# Patient Record
Sex: Female | Born: 1960
Health system: Southern US, Community
[De-identification: ages and names within clinical notes are randomized; demographics above are authoritative.]

## PROBLEM LIST (undated history)

## (undated) DIAGNOSIS — I10 Essential (primary) hypertension: Secondary | ICD-10-CM

## (undated) DIAGNOSIS — I639 Cerebral infarction, unspecified: Secondary | ICD-10-CM

## (undated) DIAGNOSIS — E119 Type 2 diabetes mellitus without complications: Secondary | ICD-10-CM

---

## 2016-02-04 MED FILL — LISINOPRIL 20 MG TABLET: 20 | 30 days supply | Qty: 30 | Fill #0

## 2016-03-10 MED FILL — LISINOPRIL 20 MG TABLET: 20 | 30 days supply | Qty: 30 | Fill #0

## 2016-03-29 MED FILL — METOPROLOL SUCC ER 25 MG TA: 25 | 30 days supply | Qty: 30 | Fill #0

## 2016-03-29 MED FILL — traMADol HCL 50 MG TABS: 50 | 10 days supply | Qty: 30 | Fill #0

## 2016-04-07 MED FILL — CLOPIDOGREL 75 MG TABLET: 75 | 30 days supply | Qty: 30 | Fill #0

## 2016-04-07 MED FILL — ISOSORBIDE MN ER 30 MG TAB: 30 | 30 days supply | Qty: 15 | Fill #0

## 2016-04-07 MED FILL — FAMOTIDINE 20 MG TABLET: 20 | 30 days supply | Qty: 60 | Fill #0

## 2016-04-07 MED FILL — ?ATORVASTATIN 40MG TABLET: 40 | 30 days supply | Qty: 30 | Fill #0

## 2016-04-12 MED FILL — GABAPENTIN 300 MG CAPSULE: 300 | 30 days supply | Qty: 90 | Fill #0

## 2016-04-15 MED FILL — ?LISINOPRIL 20 MG TABLET: 20 | 30 days supply | Qty: 60 | Fill #0

## 2016-05-04 MED FILL — FAMOTIDINE 20 MG TABLET: 20 | 30 days supply | Qty: 60 | Fill #0

## 2016-05-05 MED FILL — ?ATORVASTATIN 40MG TABLET: 40 | 30 days supply | Qty: 30 | Fill #0

## 2016-05-05 MED FILL — ISOSORBIDE MN ER 30 MG TAB: 30 | 30 days supply | Qty: 15 | Fill #1

## 2016-05-07 MED FILL — traMADol HCL 50 MG TABS: 50 | 10 days supply | Qty: 30 | Fill #0

## 2016-05-07 MED FILL — CLOPIDOGREL 75 MG TABLET: 75 | 30 days supply | Qty: 30 | Fill #0

## 2016-05-18 MED FILL — METOPROLOL SUCC ER 25 MG TA: 25 | 30 days supply | Qty: 30 | Fill #0

## 2016-05-18 MED FILL — ?LISINOPRIL 20 MG TABLET: 20 | 30 days supply | Qty: 60 | Fill #0

## 2016-05-20 MED FILL — ISOSORBIDE MN ER 30 MG TAB: 30 | 30 days supply | Qty: 30 | Fill #0

## 2016-05-25 MED FILL — DICLOFENAC SODIUM 1% GEL: 1 | 17 days supply | Qty: 100 | Fill #0

## 2016-05-28 MED FILL — CLOPIDOGREL 75 MG TABLET: 75 | 30 days supply | Qty: 30 | Fill #1

## 2016-05-28 MED FILL — GABAPENTIN 300 MG CAPSULE: 300 | 30 days supply | Qty: 90 | Fill #1

## 2016-05-28 MED FILL — CEPHALEXIN 500 MG CAPSULE: 500 | 7 days supply | Qty: 14 | Fill #0

## 2016-05-28 MED FILL — ATORVASTATIN 40 MG TABLET: 40 | 30 days supply | Qty: 30 | Fill #1

## 2016-05-31 MED FILL — ISOSORBIDE MN ER 60 MG TAB: 60 | 30 days supply | Qty: 30 | Fill #0

## 2016-06-11 MED FILL — LISINOPRIL 20 MG TABLET: 20 | 30 days supply | Qty: 60 | Fill #0

## 2016-07-05 MED FILL — ?FAMOTIDINE 20 MG TABLET: 20 | 30 days supply | Qty: 60 | Fill #0

## 2016-07-05 MED FILL — GABAPENTIN 300 MG CAPSULE: 300 | 30 days supply | Qty: 90 | Fill #0

## 2016-07-05 MED FILL — METOPROLOL SUCC ER 50 MG TA: 50 | 30 days supply | Qty: 30 | Fill #0

## 2016-07-05 MED FILL — ISOSORBIDE MN ER 60 MG TAB: 60 | 30 days supply | Qty: 30 | Fill #0

## 2016-07-05 MED FILL — CLOPIDOGREL 75 MG TABLET: 75 | 30 days supply | Qty: 30 | Fill #0

## 2016-07-05 MED FILL — traMADol HCL 50 MG TABS: 50 | 15 days supply | Qty: 45 | Fill #0

## 2016-07-05 MED FILL — ?ATORVASTATIN 40MG TABLET: 40 | 30 days supply | Qty: 30 | Fill #0

## 2016-07-05 MED FILL — LISINOPRIL 20 MG TABLET: 20 | 30 days supply | Qty: 60 | Fill #0

## 2016-07-16 MED FILL — VIT D2 1.25 MG (50,000 UNIT: 1.25 MG | 84 days supply | Qty: 12 | Fill #0

## 2016-07-27 MED FILL — METOPROLOL SUCC ER 50 MG TA: 50 | 30 days supply | Qty: 30 | Fill #1

## 2016-07-27 MED FILL — ISOSORBIDE MN ER 60 MG TAB: 60 | 30 days supply | Qty: 30 | Fill #1

## 2016-07-28 MED FILL — CLOPIDOGREL 75 MG TABLET: 75 | 30 days supply | Qty: 30 | Fill #1

## 2016-08-05 MED FILL — ?ATORVASTATIN 40MG TABLET: 40 | 30 days supply | Qty: 30 | Fill #1

## 2016-08-16 MED FILL — LISINOPRIL 20 MG TABLET: 20 | 30 days supply | Qty: 60 | Fill #1 | Status: TO

## 2016-08-16 MED FILL — traMADol HCL 50 MG TABS: 50 | 15 days supply | Qty: 45 | Fill #0

## 2016-08-16 MED FILL — ?FAMOTIDINE 20 MG TABLET: 20 | 30 days supply | Qty: 60 | Fill #1

## 2016-10-07 MED FILL — traMADol HCL 50 MG TABS: 50 | 10 days supply | Qty: 30 | Fill #0

## 2016-10-07 MED FILL — ?FAMOTIDINE 20 MG TABLET: 20 | 30 days supply | Qty: 60 | Fill #2

## 2016-10-11 MED FILL — CEPHALEXIN 500 MG CAPSULE: 500 | 7 days supply | Qty: 28 | Fill #0

## 2016-10-13 MED FILL — NAPROXEN 500 MG TABLET: 500 | 30 days supply | Qty: 60 | Fill #0

## 2016-10-15 MED FILL — traMADol HCL 50 MG TABS: 50 | 10 days supply | Qty: 30 | Fill #0

## 2016-11-03 MED FILL — traMADol HCL 50 MG TABS: 50 | 10 days supply | Qty: 30 | Fill #0

## 2017-01-19 MED FILL — NovoLOG 100 UNIT/ML SOLN: 100 | 28 days supply | Qty: 10 | Fill #0

## 2017-01-19 MED FILL — LANTUS 100 UNITS/ML VIAL: 100 | 28 days supply | Qty: 10 | Fill #0

## 2017-01-19 MED FILL — ULTICARE SYR 0.5 ML 30GX5/1: 30G X 5/16" | 30 days supply | Qty: 30 | Fill #0

## 2017-01-19 MED FILL — CIPROFLOXACIN HCL 500 MG TA: 500 | 8 days supply | Qty: 16 | Fill #0

## 2017-01-19 MED FILL — PANTOPRAZOLE SOD DR 40 MG T: 40 | 30 days supply | Qty: 30 | Fill #0

## 2017-02-23 MED FILL — hydrALAZINE HCL 50 MG TABS: 50 | 30 days supply | Qty: 90 | Fill #0

## 2017-02-23 MED FILL — ACETAMINOPHEN/COD #3 TABLET: 300-30 | 30 days supply | Qty: 60 | Fill #0

## 2017-02-23 MED FILL — METOPROLOL SUCC ER 50 MG TA: 50 | 30 days supply | Qty: 30 | Fill #0

## 2017-03-23 MED FILL — !NOVOLOG 100UNITS/ML VIAL: 100/ML | 27 days supply | Qty: 10 | Fill #0

## 2017-03-23 MED FILL — NAPROXEN 500 MG TABLET: 500 | 30 days supply | Qty: 60 | Fill #0

## 2017-03-23 MED FILL — ?ATORVASTATIN 40MG TABLET: 40 | 30 days supply | Qty: 30 | Fill #0

## 2017-03-23 MED FILL — GABAPENTIN 300 MG CAPSULE: 300 | 30 days supply | Qty: 180 | Fill #0

## 2017-03-23 MED FILL — hydrALAZINE HCL 50 MG TABS: 50 | 30 days supply | Qty: 90 | Fill #0

## 2017-03-23 MED FILL — METOPROLOL SUCC ER 50 MG TA: 50 | 30 days supply | Qty: 30 | Fill #0

## 2017-03-23 MED FILL — LISINOPRIL 20 MG TAB: 20 | 30 days supply | Qty: 60 | Fill #0

## 2017-03-23 MED FILL — !LANTUS 100 UNITS/ML VIAL: 100 | 28 days supply | Qty: 10 | Fill #0

## 2017-03-23 MED FILL — METHOCARBAMOL 500 MG TABLET: 500 | 30 days supply | Qty: 90 | Fill #0

## 2017-03-23 MED FILL — ?AMITRIPTYLINE HCL 25 MG TA: 25 | 30 days supply | Qty: 30 | Fill #0

## 2017-03-23 MED FILL — ISOSORBIDE MN ER 60 MG TAB: 60 | 30 days supply | Qty: 30 | Fill #0

## 2017-03-23 MED FILL — CLOPIDOGREL 75 MG TABLET: 75 | 30 days supply | Qty: 30 | Fill #0

## 2017-03-23 MED FILL — ACETAMINOPHEN/COD #3 TABLET: 300-30 | 30 days supply | Qty: 60 | Fill #1

## 2017-06-06 MED FILL — !LANTUS 100 UNITS/ML VIAL: 100 | 30 days supply | Qty: 10 | Fill #0

## 2017-06-06 MED FILL — risperiDONE 0.5 MG TABS: 0.5 | 30 days supply | Qty: 90 | Fill #0

## 2017-06-06 MED FILL — ?ATORVASTATIN 40MG TABLET: 40 | 30 days supply | Qty: 30 | Fill #0

## 2017-06-06 MED FILL — ?FLUOXETINE HCL 20 MG CAP: 20 | 30 days supply | Qty: 90 | Fill #0

## 2017-06-06 MED FILL — GABAPENTIN 300 MG CAPSULE: 300 | 15 days supply | Qty: 90 | Fill #0

## 2017-06-06 MED FILL — BENZTROPINE MES 0.5 MG TAB: 0.5 | 30 days supply | Qty: 60 | Fill #0

## 2017-06-23 MED FILL — hydrALAZINE HCL 50 MG TABS: 50 | 30 days supply | Qty: 90 | Fill #1

## 2017-06-23 MED FILL — ?AMITRIPTYLINE HCL 25 MG TA: 25 | 30 days supply | Qty: 30 | Fill #1

## 2017-06-23 MED FILL — METHOCARBAMOL 500 MG TABS: 500 | 30 days supply | Qty: 90 | Fill #1

## 2017-06-23 MED FILL — ?ATORVASTATIN 40MG TABLET: 40 | 30 days supply | Qty: 30 | Fill #1

## 2017-06-23 MED FILL — NAPROXEN 500 MG TABLET: 500 | 30 days supply | Qty: 60 | Fill #1

## 2017-06-23 MED FILL — ?CLOPIDOGREL 75MG TAB: 75 | 30 days supply | Qty: 30 | Fill #1

## 2017-06-23 MED FILL — ACETAMINOPHEN/COD #3 TABLET: 300-30 | 30 days supply | Qty: 60 | Fill #0

## 2017-06-23 MED FILL — ISOSORBIDE MN ER 60 MG TAB: 60 | 30 days supply | Qty: 30 | Fill #1

## 2017-06-23 MED FILL — LISINOPRIL 20 MG TAB: 20 | 30 days supply | Qty: 60 | Fill #1

## 2017-08-19 MED FILL — ISOSORBIDE MN ER 60 MG TAB: 60 | 30 days supply | Qty: 30 | Fill #2

## 2017-08-19 MED FILL — ?AMITRIPTYLINE HCL 25 MG TA: 25 | 30 days supply | Qty: 30 | Fill #2

## 2017-08-19 MED FILL — ?ATORVASTATIN 40MG TABLET: 40 | 30 days supply | Qty: 30 | Fill #2

## 2017-08-19 MED FILL — LISINOPRIL 20 MG TAB: 20 | 30 days supply | Qty: 60 | Fill #2

## 2017-08-19 MED FILL — ACETAMINOPHEN/COD #3 TABLET: 300-30 | 30 days supply | Qty: 60 | Fill #1

## 2017-08-19 MED FILL — hydrALAZINE HCL 50 MG TABS: 50 | 30 days supply | Qty: 90 | Fill #2

## 2017-09-21 MED FILL — GABAPENTIN 300 MG CAPSULE: 300 | 30 days supply | Qty: 180 | Fill #1

## 2017-09-21 MED FILL — ISOSORBIDE MN ER 60 MG TAB: 60 | 30 days supply | Qty: 30 | Fill #3

## 2017-09-21 MED FILL — hydrALAZINE HCL 50 MG TABS: 50 | 30 days supply | Qty: 90 | Fill #3

## 2017-09-21 MED FILL — ?AMITRIPTYLINE HCL 25 MG TA: 25 | 30 days supply | Qty: 30 | Fill #3

## 2017-09-21 MED FILL — LISINOPRIL 20 MG TAB: 20 | 30 days supply | Qty: 60 | Fill #3

## 2017-09-21 MED FILL — ?ATORVASTATIN 40MG TABLET: 40 | 30 days supply | Qty: 30 | Fill #3

## 2017-09-26 MED FILL — METHOCARBAMOL 500 MG TABLET: 500 | 30 days supply | Qty: 90 | Fill #0

## 2017-09-26 MED FILL — ?HUMALOG 100 UNITS/ML VIAL: 100 | 28 days supply | Qty: 10 | Fill #0

## 2017-09-26 MED FILL — ?DULOXETINE HCL 60 MG CPEP: 60 | 30 days supply | Qty: 30 | Fill #0

## 2017-09-26 MED FILL — !LANTUS 100 UNITS/ML VIAL: 100 | 28 days supply | Qty: 10 | Fill #0

## 2017-10-24 MED FILL — ?ATORVASTATIN 40MG TABLET: 40 | 30 days supply | Qty: 30 | Fill #4

## 2017-10-24 MED FILL — ?HUMALOG 100 UNITS/ML VIAL: 100 | 28 days supply | Qty: 10 | Fill #1

## 2017-10-24 MED FILL — ISOSORBIDE MN ER 60 MG TAB: 60 | 30 days supply | Qty: 30 | Fill #4

## 2017-10-24 MED FILL — $LANTUS 100 UNITS/ML VIAL: 100 | 28 days supply | Qty: 10 | Fill #1

## 2017-10-24 MED FILL — ?DULoxetine HCL 60MG CPEP: 60 | 30 days supply | Qty: 30 | Fill #1

## 2017-10-24 MED FILL — LISINOPRIL 20 MG TAB: 20 | 30 days supply | Qty: 60 | Fill #4

## 2017-10-24 MED FILL — ?AMITRIPTYLINE HCL 25 MG TA: 25 | 30 days supply | Qty: 30 | Fill #4

## 2017-10-24 MED FILL — ?HYDRALAZINE 50MG TABLET: 50 | 30 days supply | Qty: 90 | Fill #4

## 2017-11-14 MED FILL — DICLOFENAC SODIUM 1% GEL: 1 | 12 days supply | Qty: 100 | Fill #0

## 2017-11-14 MED FILL — !TRUE METRIX BLOOD GLUCOSE: 365 days supply | Qty: 1 | Fill #0

## 2017-11-14 MED FILL — TRUE METRIX TEST STRIP: 30 days supply | Qty: 100 | Fill #0

## 2017-11-14 MED FILL — TRUEplus LANCETS 28G MISC: 30 days supply | Qty: 100 | Fill #0

## 2017-11-14 MED FILL — ?CLOPIDOGREL 75MG TABLET: 75 | 30 days supply | Qty: 30 | Fill #0

## 2017-11-14 MED FILL — GABAPENTIN 300 MG CAPSULE: 300 | 30 days supply | Qty: 180 | Fill #0

## 2017-11-14 MED FILL — METHOCARBAMOL 500 MG TABLET: 500 | 30 days supply | Qty: 90 | Fill #0

## 2017-11-14 MED FILL — ACETAMINOPHEN/COD #3 TABLET: 300-30 | 30 days supply | Qty: 60 | Fill #0

## 2017-11-14 MED FILL — METOPROLOL SUCCINATE ER 50: 50 | 30 days supply | Qty: 30 | Fill #0

## 2017-11-22 MED FILL — ?HUMALOG 100 UNITS/ML VIAL: 100 | 28 days supply | Qty: 10 | Fill #2

## 2017-11-22 MED FILL — $LANTUS 100 UNITS/ML VIAL: 100 | 28 days supply | Qty: 10 | Fill #2

## 2017-11-29 MED FILL — ?DULoxetine HCL 60MG CPEP: 60 | 30 days supply | Qty: 30 | Fill #2

## 2017-11-29 MED FILL — ?HYDRALAZINE 50MG TABLET: 50 | 28 days supply | Qty: 85 | Fill #5

## 2017-11-29 MED FILL — ISOSORBIDE MN ER 60 MG TAB: 60 | 30 days supply | Qty: 30 | Fill #5

## 2017-11-29 MED FILL — ?AMITRIPTYLINE HCL 25 MG TA: 25 | 30 days supply | Qty: 30 | Fill #5

## 2017-12-08 MED FILL — FLUCONAZOLE 150 MG TABLET: 150 | 1 days supply | Qty: 1 | Fill #0

## 2017-12-08 MED FILL — ?NITROFURANTOIN-MACRO 100 M: 100 | 10 days supply | Qty: 20 | Fill #0

## 2018-01-02 MED FILL — ISOSORBIDE MN ER 60 MG TAB: 60 | 30 days supply | Qty: 30 | Fill #0

## 2018-01-02 MED FILL — hydrALAZINE HCL 50 MG TABS: 50 | 28 days supply | Qty: 85 | Fill #6

## 2018-01-02 MED FILL — LISINOPRIL 20 MG TAB: 20 | 30 days supply | Qty: 60 | Fill #5

## 2018-01-02 MED FILL — ACETAMINOPHEN/COD #3 TABLET: 300-30 | 30 days supply | Qty: 60 | Fill #1

## 2018-01-02 MED FILL — $LANTUS 100 UNITS/ML VIAL: 100 | 28 days supply | Qty: 10 | Fill #3

## 2018-01-02 MED FILL — ?AMITRIPTYLINE HCL 25 MG TA: 25 | 30 days supply | Qty: 30 | Fill #0

## 2018-01-02 MED FILL — ?ATORVASTATIN 40MG TABLET: 40 | 30 days supply | Qty: 30 | Fill #5

## 2018-01-02 MED FILL — ?DULoxetine HCL 60MG CPEP: 60 | 30 days supply | Qty: 30 | Fill #3

## 2018-02-07 MED FILL — ?ATORVASTATIN 40MG TABLET: 40 | 30 days supply | Qty: 30 | Fill #0

## 2018-02-07 MED FILL — ISOSORBIDE MN ER 60 MG TAB: 60 | 30 days supply | Qty: 30 | Fill #1

## 2018-02-07 MED FILL — ?AMITRIPTYLINE HCL 25 MG TA: 25 | 30 days supply | Qty: 30 | Fill #1

## 2018-02-07 MED FILL — ?DULoxetine HCL 6OMG CP: 60 | 30 days supply | Qty: 30 | Fill #0

## 2018-02-07 MED FILL — traMADol HCL 50 MG TABS: 50 | 4 days supply | Qty: 15 | Fill #0

## 2018-02-07 MED FILL — $LANTUS 100 UNITS/ML VIAL: 100 | 28 days supply | Qty: 10 | Fill #4

## 2018-02-07 MED FILL — METHOCARBAMOL 500 MG TABS: 500 | 10 days supply | Qty: 20 | Fill #0

## 2018-02-15 MED FILL — ?CLOPIDOGREL 75MG TAB: 75 | 30 days supply | Qty: 30 | Fill #0

## 2018-02-15 MED FILL — LISINOPRIL 20 MG TAB: 20 | 30 days supply | Qty: 60 | Fill #0

## 2018-02-23 MED FILL — predniSONE 5 MG TABS: 5 | 6 days supply | Qty: 21 | Fill #0

## 2018-03-01 MED FILL — GABAPENTIN 300 MG CAPSULE: 300 | 30 days supply | Qty: 180 | Fill #1

## 2018-03-23 MED FILL — ?DULOXetine HCL 60MG CAPS: 60 | 30 days supply | Qty: 30 | Fill #1

## 2018-03-23 MED FILL — ISOSORBIDE MN ER 60 MG TAB: 60 | 30 days supply | Qty: 30 | Fill #2

## 2018-03-23 MED FILL — ?ATORVASTATIN 40MG TABLET: 40 | 30 days supply | Qty: 30 | Fill #1

## 2018-03-23 MED FILL — GABAPENTIN 300 MG CAPSULE: 300 | 30 days supply | Qty: 180 | Fill #2

## 2018-03-23 MED FILL — ?CLOPIDOGREL 75MG TAB: 75 | 30 days supply | Qty: 30 | Fill #1

## 2018-03-23 MED FILL — $LANTUS 100 UNITS/ML VIAL: 100 | 28 days supply | Qty: 10 | Fill #5

## 2018-03-23 MED FILL — METHOCARBAMOL 500 MG TABS: 500 | 30 days supply | Qty: 90 | Fill #2

## 2018-03-23 MED FILL — hydrALAZINE HCL 50 MG TABS: 50 | 3 days supply | Qty: 10 | Fill #7

## 2018-03-23 MED FILL — LISINOPRIL 20 MG TAB: 20 | 30 days supply | Qty: 60 | Fill #1

## 2018-03-23 MED FILL — ?AMITRIPTYLINE HCL 25 MG TA: 25 | 30 days supply | Qty: 30 | Fill #2

## 2018-03-30 MED FILL — ACETAMINOPHEN/COD #3 TABLET: 300-30 | 30 days supply | Qty: 60 | Fill #0

## 2018-04-06 MED FILL — DICLOFENAC SODIUM 1% GEL: 1 | 12 days supply | Qty: 100 | Fill #1

## 2018-05-10 MED FILL — hydrALAZINE HCL 50 MG TABS: 50 | 30 days supply | Qty: 90 | Fill #0

## 2018-05-10 MED FILL — LISINOPRIL 20 MG TAB: 20 | 30 days supply | Qty: 60 | Fill #2

## 2018-05-10 MED FILL — ?CLOPIDOGREL 75MG TA: 75 | 30 days supply | Qty: 30 | Fill #2

## 2018-05-10 MED FILL — ?ATORVASTATIN 40MG TABLET: 40 | 30 days supply | Qty: 30 | Fill #2

## 2018-05-10 MED FILL — ACETAMINOPHEN/COD #3 TABLET: 300-30 | 30 days supply | Qty: 60 | Fill #1

## 2018-05-10 MED FILL — ?AMITRIPTYLINE HCL 25 MG TA: 25 | 30 days supply | Qty: 30 | Fill #3

## 2018-05-10 MED FILL — $LANTUS 100 UNITS/ML VIAL: 100 | 28 days supply | Qty: 10 | Fill #6

## 2018-05-10 MED FILL — METHOCARBAMOL 500 MG TABS: 500 | 30 days supply | Qty: 90 | Fill #1

## 2018-05-10 MED FILL — ISOSORBIDE MN ER 60 MG TAB: 60 | 30 days supply | Qty: 30 | Fill #3

## 2018-05-10 MED FILL — DULoxetine HCL 60 MG CPEP: 60 | 30 days supply | Qty: 30 | Fill #2

## 2018-05-19 MED FILL — CEPHALEXIN 500 MG CAPSULE: 500 | 7 days supply | Qty: 28 | Fill #0

## 2018-05-23 MED FILL — METOPROLOL SUCCINATE ER 50: 50 | 30 days supply | Qty: 30 | Fill #0

## 2018-05-23 MED FILL — ?HUMALOG 100 UNITS/ML VIAL: 100 | 28 days supply | Qty: 10 | Fill #0

## 2018-06-06 MED FILL — LANTUS 100 UNITS/ML VIAL: 100 | 28 days supply | Qty: 10 | Fill #0

## 2018-06-06 MED FILL — CARAFATE 1 GM/10 ML SUSP: 1 | 10 days supply | Qty: 420 | Fill #0

## 2018-06-06 MED FILL — PANTOPRAZOLE SOD DR 40 MG T: 40 | 30 days supply | Qty: 60 | Fill #0

## 2018-06-06 MED FILL — CLOPIDOGREL 75 MG TABLET: 75 | 30 days supply | Qty: 30 | Fill #0

## 2018-07-12 MED FILL — $LANTUS 100 UNITS/ML VIAL: 100 | 28 days supply | Qty: 10 | Fill #0

## 2018-07-12 MED FILL — ACETAMINOPHEN/COD #3 TABLET: 300-30 | 30 days supply | Qty: 60 | Fill #0

## 2018-07-12 MED FILL — hydrALAZINE HCL 25 MG TABS: 25 | 30 days supply | Qty: 90 | Fill #0

## 2018-07-17 MED FILL — ?ATORVASTATIN 40MG TABLET: 40 | 30 days supply | Qty: 30 | Fill #0

## 2018-07-17 MED FILL — GABAPENTIN 300 MG CAPSULE: 300 | 30 days supply | Qty: 180 | Fill #3

## 2018-07-17 MED FILL — ?CLOPIDOGREL 75MG TA: 75 | 30 days supply | Qty: 30 | Fill #1

## 2018-07-17 MED FILL — ?DULoxetine HCL 60MG CPEP: 60 | 30 days supply | Qty: 30 | Fill #0

## 2018-07-17 MED FILL — METOPROLOL SUCCINATE ER 50: 50 | 30 days supply | Qty: 30 | Fill #1

## 2018-07-17 MED FILL — ISOSORBIDE MN ER 60 MG TAB: 60 | 30 days supply | Qty: 30 | Fill #0

## 2018-07-18 MED FILL — AMITRIPTYLINE HCL 25 MG TAB: 25 | 30 days supply | Qty: 30 | Fill #0

## 2018-07-19 MED FILL — DICLOFENAC SODIUM 1% GEL: 1 | 12 days supply | Qty: 100 | Fill #0

## 2018-07-26 MED FILL — DICLOFENAC SODIUM 1% GEL: 1 | 12 days supply | Qty: 100 | Fill #1

## 2018-08-15 MED FILL — ?AMITRIPTYLINE HCL 25MG TA: 25 | 30 days supply | Qty: 30 | Fill #1

## 2018-08-15 MED FILL — METHOCARBAMOL 500 MG TABS: 500 | 30 days supply | Qty: 90 | Fill #2

## 2018-08-15 MED FILL — LISINOPRIL 20 MG TAB: 20 | 30 days supply | Qty: 60 | Fill #0

## 2018-08-15 MED FILL — METOPROLOL SUCCINATE ER 50: 50 | 30 days supply | Qty: 30 | Fill #2

## 2018-08-15 MED FILL — ?DULoxetine HCL 60MG CPEP: 60 | 30 days supply | Qty: 30 | Fill #1

## 2018-08-15 MED FILL — ?HYDRALAZINE 25MG TAB: 25 | 30 days supply | Qty: 90 | Fill #1

## 2018-08-15 MED FILL — DICLOFENAC SODIUM 1% GEL: 1 | 12 days supply | Qty: 100 | Fill #2

## 2018-08-15 MED FILL — GABAPENTIN 300 MG CAPSULE: 300 | 30 days supply | Qty: 180 | Fill #0

## 2018-08-15 MED FILL — ISOSORBIDE MN ER 60 MG TAB: 60 | 30 days supply | Qty: 30 | Fill #1

## 2018-08-15 MED FILL — ACETAMINOPHEN/COD #3 TABLET: 300-30 | 30 days supply | Qty: 60 | Fill #1

## 2018-08-15 MED FILL — ?ATORVASTATIN 40MG TABLET: 40 | 30 days supply | Qty: 30 | Fill #1

## 2018-08-15 MED FILL — CLOPIDOGREL 75 MG TABLET: 75 | 30 days supply | Qty: 30 | Fill #2

## 2018-08-16 MED FILL — ?HUMALOG 100 UNITS/ML VIAL: 100 | 28 days supply | Qty: 10 | Fill #0

## 2018-09-15 MED FILL — GABAPENTIN 300 MG CAPSULE: 300 | 30 days supply | Qty: 180 | Fill #1

## 2018-09-15 MED FILL — ISOSORBIDE MN ER 60 MG TAB: 60 | 30 days supply | Qty: 30 | Fill #2

## 2018-09-15 MED FILL — ?DULoxetine HCL 60MG CPEP: 60 | 30 days supply | Qty: 30 | Fill #2

## 2018-09-15 MED FILL — METOPROLOL SUCCINATE ER 50: 50 | 30 days supply | Qty: 30 | Fill #3

## 2018-09-15 MED FILL — ?AMITRIPTYLINE HCL 25MG TA: 25 | 30 days supply | Qty: 30 | Fill #2

## 2018-09-15 MED FILL — DICLOFENAC SODIUM 1% GEL: 1 | 12 days supply | Qty: 100 | Fill #2

## 2018-09-15 MED FILL — CLOPIDOGREL 75 MG TABLET: 75 | 30 days supply | Qty: 30 | Fill #3

## 2018-09-15 MED FILL — ?ATORVASTATIN 40MG TABLET: 40 | 30 days supply | Qty: 30 | Fill #2

## 2018-10-11 MED FILL — LISINOPRIL 20 MG TAB: 20 | 30 days supply | Qty: 60 | Fill #1

## 2018-10-11 MED FILL — METOPROLOL SUCCINATE ER 50: 50 | 30 days supply | Qty: 30 | Fill #4

## 2018-10-11 MED FILL — ?AMITRIPTYLINE HCL 25MG TA: 25 | 30 days supply | Qty: 30 | Fill #3

## 2018-10-11 MED FILL — $LANTUS 100 UNITS/ML VIAL: 100 | 84 days supply | Qty: 30 | Fill #0

## 2018-10-11 MED FILL — ACETAMINOPHEN/COD #3 TABLET: 300-30 | 30 days supply | Qty: 60 | Fill #0

## 2018-10-11 MED FILL — ?ATORVASTATIN 40MG TABLET: 40 | 30 days supply | Qty: 30 | Fill #3

## 2018-10-11 MED FILL — CLOPIDOGREL 75 MG TABLET: 75 | 30 days supply | Qty: 30 | Fill #3

## 2018-10-11 MED FILL — ?PANTOPRAZOLE SOD DR 40MG T: 40 | 30 days supply | Qty: 60 | Fill #1

## 2018-10-11 MED FILL — hydrALAZINE HCL 25 MG TABS: 25 | 30 days supply | Qty: 90 | Fill #0

## 2018-10-11 MED FILL — ?HUMALOG 100 UNITS/ML VIAL: 100 | 28 days supply | Qty: 10 | Fill #1

## 2018-10-11 MED FILL — DICLOFENAC SODIUM 1% GEL: 1 | 12 days supply | Qty: 100 | Fill #0

## 2018-10-11 MED FILL — ?DULoxetine HCL 60MG CPEP: 60 | 30 days supply | Qty: 30 | Fill #3

## 2018-11-01 MED FILL — DICLOFENAC SODIUM 1% GEL: 1 | 12 days supply | Qty: 100 | Fill #1

## 2018-11-15 MED FILL — ?ATORVASTATIN 40MG TABLET: 40 | 30 days supply | Qty: 30 | Fill #0

## 2018-11-15 MED FILL — METHOCARBAMOL 500 MG TABS: 500 | 30 days supply | Qty: 60 | Fill #0

## 2018-11-15 MED FILL — ?HUMALOG 100 UNITS/ML VIAL: 100 | 30 days supply | Qty: 10 | Fill #0

## 2018-11-15 MED FILL — LISINOPRIL 40 MG TABLET: 40 | 30 days supply | Qty: 30 | Fill #0

## 2018-11-15 MED FILL — ?METOPROLOL SUCC ER 50MG TA: 50 | 30 days supply | Qty: 30 | Fill #0

## 2018-11-15 MED FILL — $LANTUS 100 UNITS/ML VIAL: 100 | 75 days supply | Qty: 30 | Fill #0

## 2018-11-15 MED FILL — ?DULoxetine HCL 6OMG CP: 60 | 30 days supply | Qty: 30 | Fill #0

## 2018-11-15 MED FILL — ACETAMINOPHEN/COD #3 TABLET: 300-30 | 30 days supply | Qty: 60 | Fill #0

## 2018-11-15 MED FILL — ISOSORBIDE MN ER 60 MG TAB: 60 | 30 days supply | Qty: 30 | Fill #0

## 2018-11-15 MED FILL — GABAPENTIN 300 MG CAPSULE: 300 | 30 days supply | Qty: 180 | Fill #0

## 2018-11-15 MED FILL — hydrALAZINE HCL 50 MG TABS: 50 | 30 days supply | Qty: 45 | Fill #0

## 2018-11-20 MED FILL — DICLOFENAC SODIUM 1% GEL: 1 | 12 days supply | Qty: 100 | Fill #2

## 2018-12-13 MED FILL — METHOCARBAMOL 500 MG TABS: 500 | 30 days supply | Qty: 60 | Fill #1

## 2018-12-13 MED FILL — ISOSORBIDE MN ER 60 MG TAB: 60 | 30 days supply | Qty: 30 | Fill #1

## 2018-12-13 MED FILL — ?AMITRIPTYLINE HCL 25 MG TA: 25 | 60 days supply | Qty: 60 | Fill #4

## 2018-12-13 MED FILL — ACETAMINOPHEN/COD #3 TABLET: 300-30 | 30 days supply | Qty: 60 | Fill #1

## 2018-12-13 MED FILL — hydrALAZINE HCL 50 MG TABS: 50 | 30 days supply | Qty: 45 | Fill #1

## 2018-12-13 MED FILL — ?ATORVASTATIN 40MG TABLET: 40 | 60 days supply | Qty: 60 | Fill #4

## 2018-12-13 MED FILL — LISINOPRIL 40 MG TABLET: 40 | 30 days supply | Qty: 30 | Fill #1

## 2018-12-13 MED FILL — ?METOPROLOL SUCC ER 50MG TA: 50 | 60 days supply | Qty: 60 | Fill #5

## 2018-12-14 MED FILL — ISOSORBIDE MN ER 60 MG TAB: 60 | 30 days supply | Qty: 30 | Fill #2

## 2018-12-14 MED FILL — DICLOFENAC SODIUM 1% GEL: 1 | 7 days supply | Qty: 100 | Fill #0

## 2018-12-18 MED FILL — PANTOPRAZOLE SOD DR 40 MG T: 40 | 30 days supply | Qty: 60 | Fill #0

## 2019-01-08 MED FILL — hydrALAZINE HCL 50 MG TABS: 50 | 30 days supply | Qty: 45 | Fill #2

## 2019-01-08 MED FILL — LISINOPRIL 40 MG TABLET: 40 | 30 days supply | Qty: 30 | Fill #2

## 2019-01-08 MED FILL — GABAPENTIN 300 MG CAPSULE: 300 | 30 days supply | Qty: 180 | Fill #1

## 2019-01-08 MED FILL — DICLOFENAC SODIUM 1% GEL: 1 | 7 days supply | Qty: 100 | Fill #1

## 2019-01-19 MED FILL — DICLOFENAC SODIUM 1% GEL: 1 | 7 days supply | Qty: 100 | Fill #2

## 2019-01-19 MED FILL — METHOCARBAMOL 500 MG TABS: 500 | 30 days supply | Qty: 60 | Fill #2

## 2019-01-22 MED FILL — ?PANTOPRAZOLE SOD DR 40MGTA: 40 | 30 days supply | Qty: 60 | Fill #0

## 2019-02-13 MED FILL — ?ATORVASTATIN 40MG TABLET: 40 | 30 days supply | Qty: 30 | Fill #1

## 2019-02-13 MED FILL — ?AMITRIPTYLINE HCL 25 MG TA: 25 | 30 days supply | Qty: 30 | Fill #0

## 2019-02-13 MED FILL — GABAPENTIN 300 MG CAPSULE: 300 | 30 days supply | Qty: 180 | Fill #2

## 2019-02-13 MED FILL — ?HUMALOG 100 UNITS/ML VIAL: 100 | 30 days supply | Qty: 10 | Fill #1

## 2019-02-13 MED FILL — $LANTUS 100 UNITS/ML VIAL: 100 | 75 days supply | Qty: 30 | Fill #1

## 2019-02-13 MED FILL — METHOCARBAMOL 500 MG TABS: 500 | 30 days supply | Qty: 60 | Fill #3

## 2019-02-13 MED FILL — ?METOPROLOL SUCC ER 50MG TA: 50 | 30 days supply | Qty: 30 | Fill #1

## 2019-02-13 MED FILL — hydrALAZINE HCL 50 MG TABS: 50 | 30 days supply | Qty: 45 | Fill #3

## 2019-02-13 MED FILL — LISINOPRIL 40 MG TABLET: 40 | 30 days supply | Qty: 30 | Fill #3

## 2019-02-13 MED FILL — ISOSORBIDE MN ER 60 MG TAB: 60 | 30 days supply | Qty: 30 | Fill #3

## 2019-02-13 MED FILL — ?DULoxetine HCL 60 MG CPEP: 60 | 30 days supply | Qty: 30 | Fill #0

## 2019-02-13 MED FILL — ?PANTOPRAZOLE SOD DR 40MGTA: 40 | 30 days supply | Qty: 60 | Fill #1

## 2019-02-13 MED FILL — DICLOFENAC SODIUM 1% GEL: 1 | 12 days supply | Qty: 100 | Fill #0

## 2019-02-13 MED FILL — ACETAMINOPHEN/COD #3 TABLET: 300-30 | 30 days supply | Qty: 60 | Fill #1

## 2019-03-08 MED FILL — DICLOFENAC SODIUM 1 % GEL: 1 | 12 days supply | Qty: 100 | Fill #1

## 2019-03-12 MED FILL — hydrALAZINE HCL 50 MG TABS: 50 | 30 days supply | Qty: 45 | Fill #4

## 2019-03-12 MED FILL — METHOCARBAMOL 500 MG TABS: 500 | 30 days supply | Qty: 60 | Fill #4

## 2019-03-12 MED FILL — ISOSORBIDE MN ER 60 MG TAB: 60 | 30 days supply | Qty: 30 | Fill #4

## 2019-03-12 MED FILL — ?METOPROLOL SUCC ER 50MG TA: 50 | 30 days supply | Qty: 30 | Fill #2

## 2019-03-12 MED FILL — GABAPENTIN 300 MG CAPSULE: 300 | 30 days supply | Qty: 180 | Fill #3

## 2019-03-12 MED FILL — LISINOPRIL 40 MG TABLET: 40 | 30 days supply | Qty: 30 | Fill #4

## 2019-03-12 MED FILL — ?ATORVASTATIN 40MG TABLET: 40 | 30 days supply | Qty: 30 | Fill #2

## 2019-03-27 MED FILL — ?HUMALOG 100 UNITS/ML VIAL: 100 | 30 days supply | Qty: 10 | Fill #2

## 2019-03-27 MED FILL — DICLOFENAC SODIUM 1% GEL: 1 | 12 days supply | Qty: 100 | Fill #2

## 2019-03-27 MED FILL — ?PANTOPRAZOLE SODI DR 40MGT: 40 | 30 days supply | Qty: 60 | Fill #2

## 2019-03-28 MED FILL — ?NAPROXEN 500 MG TABS: 500 | 30 days supply | Qty: 60 | Fill #0

## 2019-04-10 MED FILL — METHOCARBAMOL 500 MG TABS: 500 | 30 days supply | Qty: 60 | Fill #5

## 2019-04-10 MED FILL — AMITRIPTYLINE HCL 50 MG TAB: 50 | 30 days supply | Qty: 30 | Fill #0

## 2019-04-10 MED FILL — hydrALAZINE HCL 50 MG TABS: 50 | 30 days supply | Qty: 45 | Fill #5

## 2019-04-10 MED FILL — ISOSORBIDE MN ER 60 MG TAB: 60 | 30 days supply | Qty: 30 | Fill #5

## 2019-04-10 MED FILL — ?METOPROLOL SUCC ER 50MG TA: 50 | 30 days supply | Qty: 30 | Fill #3

## 2019-04-10 MED FILL — LISINOPRIL 40 MG TABLET: 40 | 30 days supply | Qty: 30 | Fill #5

## 2019-04-10 MED FILL — ACETAMINOPHEN/COD #3 TABLET: 300-30 | 30 days supply | Qty: 60 | Fill #0

## 2019-04-10 MED FILL — ?ATORVASTATIN 40MG TABLET: 40 | 30 days supply | Qty: 30 | Fill #3

## 2019-04-10 MED FILL — GABAPENTIN 300 MG CAPSULE: 300 | 30 days supply | Qty: 180 | Fill #4

## 2019-05-16 MED FILL — hydrALAZINE HCL 50 MG TABS: 50 | 30 days supply | Qty: 45 | Fill #0

## 2019-05-16 MED FILL — POLYETHYLENE GLYCOL 3350 PO: 17 | 14 days supply | Qty: 238 | Fill #0

## 2019-05-16 MED FILL — LISINOPRIL 40 MG TABLET: 40 | 30 days supply | Qty: 30 | Fill #0

## 2019-05-16 MED FILL — ISOSORBIDE MN ER 60 MG TAB: 60 | 30 days supply | Qty: 30 | Fill #0

## 2019-05-16 MED FILL — GABAPENTIN 300 MG CAPSULE: 300 | 30 days supply | Qty: 180 | Fill #0

## 2019-05-16 MED FILL — ?DULoxetine HCL 60 MG CPEP: 60 | 30 days supply | Qty: 30 | Fill #0

## 2019-05-16 MED FILL — ?METOPROLOL SUCC ER 50MG TA: 50 | 30 days supply | Qty: 30 | Fill #0

## 2019-05-16 MED FILL — ?ATORVASTATIN 40MG TABLET: 40 | 30 days supply | Qty: 30 | Fill #0

## 2019-05-16 MED FILL — METHOCARBAMOL 500 MG TABS: 500 | 15 days supply | Qty: 90 | Fill #0

## 2019-05-17 MED FILL — ACETAMINOPHEN/COD #3 TABLET: 300-30 | 30 days supply | Qty: 60 | Fill #1

## 2019-05-30 MED FILL — METHOCARBAMOL 500 MG TABS: 500 | 15 days supply | Qty: 90 | Fill #0

## 2019-05-30 MED FILL — ?NAPROXEN 500 MG TABS: 500 | 30 days supply | Qty: 60 | Fill #0

## 2019-06-11 MED FILL — ?METOPROLOL SUCC ER 50MG TA: 50 | 30 days supply | Qty: 30 | Fill #1

## 2019-06-11 MED FILL — GABAPENTIN 300 MG CAPSULE: 300 | 30 days supply | Qty: 180 | Fill #1

## 2019-06-11 MED FILL — ?DULoxetine HCL 60 MG CPEP: 60 | 30 days supply | Qty: 30 | Fill #1

## 2019-06-11 MED FILL — ?ATORVASTATIN 40MG TABLET: 40 | 30 days supply | Qty: 30 | Fill #1

## 2019-06-11 MED FILL — hydrALAZINE HCL 50 MG TABS: 50 | 30 days supply | Qty: 45 | Fill #1

## 2019-06-11 MED FILL — ISOSORBIDE MN ER 60 MG TAB: 60 | 30 days supply | Qty: 30 | Fill #1

## 2019-06-11 MED FILL — LISINOPRIL 40 MG TABLET: 40 | 30 days supply | Qty: 30 | Fill #1

## 2019-06-11 MED FILL — ?AMITRIPTYLINE HCL 50MG TAB: 50 | 30 days supply | Qty: 30 | Fill #1

## 2019-06-13 MED FILL — ACETAMINOPHEN/COD #3 TABLET: 300-30 | 30 days supply | Qty: 60 | Fill #0

## 2019-07-12 MED FILL — LISINOPRIL 40 MG TABLET: 40 | 30 days supply | Qty: 30 | Fill #2

## 2019-07-12 MED FILL — ?DULOXETINE HCL 60 MG CPEP: 60 | 30 days supply | Qty: 30 | Fill #2

## 2019-07-12 MED FILL — ISOSORBIDE MN ER 60 MG TAB: 60 | 30 days supply | Qty: 30 | Fill #2

## 2019-07-12 MED FILL — METHOCARBAMOL 500 MG TABS: 500 | 15 days supply | Qty: 90 | Fill #0

## 2019-07-12 MED FILL — ?METOPROLOL SUCC ER 50MG TA: 50 | 30 days supply | Qty: 30 | Fill #2

## 2019-07-12 MED FILL — GABAPENTIN 300 MG CAPSULE: 300 | 30 days supply | Qty: 180 | Fill #2

## 2019-07-12 MED FILL — ?ATORVASTATIN 40MG TABLET: 40 | 30 days supply | Qty: 30 | Fill #2

## 2019-07-12 MED FILL — ?AMITRIPTYLINE HCL 50MG TAB: 50 | 30 days supply | Qty: 30 | Fill #2

## 2019-07-12 MED FILL — hydrALAZINE HCL 50 MG TABS: 50 | 30 days supply | Qty: 45 | Fill #2

## 2019-07-24 MED FILL — $LANTUS 100 UNITS/ML VIAL: 100 | 28 days supply | Qty: 10 | Fill #0

## 2019-07-24 MED FILL — FLUCONAZOLE 150 MG TABLET: 150 | 1 days supply | Qty: 1 | Fill #0

## 2019-07-24 MED FILL — PANTOPRAZOLE SOD DR 40 MG T: 40 | 30 days supply | Qty: 60 | Fill #0

## 2019-07-24 MED FILL — METHOCARBAMOL 500 MG TABS: 500 | 15 days supply | Qty: 90 | Fill #0

## 2019-07-24 MED FILL — AMOXICILLIN 500 MG CAPSULE: 500 | 10 days supply | Qty: 30 | Fill #0

## 2019-08-13 MED FILL — ISOSORBIDE MN ER 60 MG TAB: 60 | 30 days supply | Qty: 30 | Fill #3

## 2019-08-13 MED FILL — ?ATORVASTATIN 40MG TABLET: 40 | 30 days supply | Qty: 30 | Fill #3

## 2019-08-13 MED FILL — GABAPENTIN 300 MG CAPSULE: 300 | 30 days supply | Qty: 180 | Fill #3

## 2019-08-13 MED FILL — LISINOPRIL 40 MG TABLET: 40 | 30 days supply | Qty: 30 | Fill #0

## 2019-08-13 MED FILL — ?METOPROLOL SUCC ER 50MG TA: 50 | 30 days supply | Qty: 30 | Fill #3

## 2019-08-13 MED FILL — ?DULOXETINE HCL 60 MG CPEP: 60 | 30 days supply | Qty: 30 | Fill #3

## 2019-08-13 MED FILL — hydrALAZINE HCL 50 MG TABS: 50 | 30 days supply | Qty: 45 | Fill #3

## 2019-08-20 MED FILL — ACETAMINOPHEN/COD #3 TABLET: 300-30 | 30 days supply | Qty: 60 | Fill #0

## 2019-08-29 MED FILL — $LANTUS 100 UNITS/ML VIAL: 100 | 84 days supply | Qty: 30 | Fill #1

## 2019-09-13 MED FILL — PANTOPRAZOLE SOD DR 40 MG T: 40 | 30 days supply | Qty: 60 | Fill #1

## 2019-09-13 MED FILL — ISOSORBIDE MN ER 60 MG TAB: 60 | 30 days supply | Qty: 30 | Fill #4

## 2019-09-13 MED FILL — GABAPENTIN 300 MG CAPSULE: 300 | 30 days supply | Qty: 180 | Fill #4

## 2019-09-13 MED FILL — METHOCARBAMOL 500 MG TABS: 500 | 15 days supply | Qty: 90 | Fill #1

## 2019-09-13 MED FILL — ?METOPROLOL SUCC ER 50MG TA: 50 | 30 days supply | Qty: 30 | Fill #4

## 2019-09-13 MED FILL — ?ATORVASTATIN 40MG TABLET: 40 | 30 days supply | Qty: 30 | Fill #4

## 2019-09-13 MED FILL — ?NAPROXEN 500 MG TABS: 500 | 30 days supply | Qty: 60 | Fill #0

## 2019-09-13 MED FILL — hydrALAZINE HCL 50 MG TABS: 50 | 30 days supply | Qty: 45 | Fill #4

## 2019-10-10 MED FILL — hydrALAZINE HCL 50 MG TABS: 50 | 30 days supply | Qty: 45 | Fill #5

## 2019-10-10 MED FILL — METHOCARBAMOL 500 MG TABS: 500 | 15 days supply | Qty: 90 | Fill #0

## 2019-10-10 MED FILL — ACETAMINOPHEN/COD #3 TABLET: 300-30 | 30 days supply | Qty: 60 | Fill #1

## 2019-10-10 MED FILL — PANTOPRAZOLE SOD DR 40 MG T: 40 | 30 days supply | Qty: 60 | Fill #2

## 2019-10-10 MED FILL — ISOSORBIDE MN ER 60 MG TAB: 60 | 30 days supply | Qty: 30 | Fill #5

## 2019-10-10 MED FILL — ?NAPROXEN 500 MG TABS: 500 | 30 days supply | Qty: 60 | Fill #0

## 2019-10-10 MED FILL — ?ATORVASTATIN 40MG TABL: 40 | 30 days supply | Qty: 30 | Fill #5

## 2019-10-10 MED FILL — METOPROLOL SUCCINATE ER 50: 50 | 30 days supply | Qty: 30 | Fill #5

## 2019-11-05 MED FILL — ACETAMINOPHEN/COD #3 TABLET: 300-30 | 30 days supply | Qty: 60 | Fill #0

## 2019-11-05 MED FILL — GABAPENTIN 300 MG CAPSULE: 300 | 30 days supply | Qty: 180 | Fill #5

## 2019-11-05 MED FILL — ?NAPROXEN 500 MG TABS: 500 | 30 days supply | Qty: 60 | Fill #0

## 2019-11-05 MED FILL — METOPROLOL SUCCINATE ER 50: 50 | 30 days supply | Qty: 30 | Fill #6

## 2019-11-05 MED FILL — ISOSORBIDE MN ER 60 MG TAB: 60 | 30 days supply | Qty: 30 | Fill #0

## 2019-11-05 MED FILL — ?PANTOPRAZOLE SO DR 40MG TA: 40 | 30 days supply | Qty: 60 | Fill #0

## 2019-11-05 MED FILL — hydrALAZINE HCL 50 MG TABS: 50 | 30 days supply | Qty: 45 | Fill #6

## 2019-12-03 MED FILL — METOPROLOL SUCCINATE ER 50: 50 | 30 days supply | Qty: 30 | Fill #0

## 2019-12-03 MED FILL — hydrALAZINE HCL 50 MG TABS: 50 | 30 days supply | Qty: 45 | Fill #7

## 2019-12-03 MED FILL — $LANTUS 100 UNITS/ML VIAL: 100 | 84 days supply | Qty: 30 | Fill #2

## 2019-12-03 MED FILL — GABAPENTIN 300 MG CAPSULE: 300 | 30 days supply | Qty: 180 | Fill #0

## 2019-12-03 MED FILL — ISOSORBIDE MN ER 60 MG TAB: 60 | 30 days supply | Qty: 30 | Fill #1

## 2019-12-03 MED FILL — ?PANTOPRAZOLE SO DR 40MG TA: 40 | 30 days supply | Qty: 60 | Fill #1

## 2019-12-04 MED FILL — ?NAPROXEN 500 MG TABS: 500 | 30 days supply | Qty: 60 | Fill #0

## 2019-12-06 MED FILL — ACETAMINOPHEN/COD #3 TABLET: 300-30 | 20 days supply | Qty: 40 | Fill #0

## 2019-12-18 MED FILL — METHOCARBAMOL 500 MG TABS: 500 | 15 days supply | Qty: 90 | Fill #1

## 2019-12-27 MED FILL — METOPROLOL SUCCINATE ER 50: 50 | 30 days supply | Qty: 30 | Fill #1

## 2019-12-27 MED FILL — ?PANTOPRAZOLE SO DR 40MG TA: 40 | 30 days supply | Qty: 60 | Fill #2

## 2019-12-27 MED FILL — ISOSORBIDE MN ER 60 MG TAB: 60 | 30 days supply | Qty: 30 | Fill #2

## 2019-12-27 MED FILL — hydrALAZINE HCL 50 MG TABS: 50 | 30 days supply | Qty: 45 | Fill #8

## 2019-12-27 MED FILL — ACETAMINOPHEN/COD #3 TABLET: 300-30 | 20 days supply | Qty: 40 | Fill #1

## 2020-01-21 MED FILL — ACETAMINOPHEN/COD #3 TABLET: 300-30 | 20 days supply | Qty: 40 | Fill #0

## 2020-02-13 MED FILL — ?CLOPIDOGREL 75MG TA: 75 | 30 days supply | Qty: 30 | Fill #0

## 2020-03-13 MED FILL — AMOXICILLIN 500 MG CAPSULE: 500 | 10 days supply | Qty: 30 | Fill #0

## 2020-03-14 MED FILL — ATORVASTATIN 80 MG TABLET: 80 | 30 days supply | Qty: 30 | Fill #0

## 2020-03-27 MED FILL — ATORVASTATIN 80 MG TABLET: 80 | 30 days supply | Qty: 30 | Fill #0

## 2020-03-27 MED FILL — GABAPENTIN 300 MG CAPSULE: 300 | 30 days supply | Qty: 180 | Fill #2

## 2020-03-27 MED FILL — hydrALAZINE HCL 25 MG TABS: 25 | 30 days supply | Qty: 90 | Fill #1

## 2020-03-27 MED FILL — LISINOPRIL 40 MG TABLET: 40 | 30 days supply | Qty: 30 | Fill #1

## 2020-03-27 MED FILL — METHOCARBAMOL 500 MG TABS: 500 | 15 days supply | Qty: 90 | Fill #1

## 2020-03-27 MED FILL — ?CLOPIDOGREL 75MG TA: 75 | 30 days supply | Qty: 30 | Fill #1

## 2020-03-27 MED FILL — METOPROLOL SUCCINATE ER 50: 50 | 30 days supply | Qty: 30 | Fill #1

## 2020-03-27 MED FILL — ?HUMALOG 100 UNITS/ML VIAL: 100 | 28 days supply | Qty: 10 | Fill #1

## 2020-03-27 MED FILL — $LANTUS 100 UNITS/ML VIAL: 100 | 25 days supply | Qty: 10 | Fill #1

## 2020-04-24 MED FILL — GABAPENTIN 300 MG CAPSULE: 300 | 30 days supply | Qty: 180 | Fill #3

## 2020-04-24 MED FILL — LISINOPRIL 40 MG TABLET: 40 | 30 days supply | Qty: 30 | Fill #2

## 2020-04-24 MED FILL — ?CLOPIDOGREL 75MG TA: 75 | 30 days supply | Qty: 30 | Fill #2

## 2020-04-24 MED FILL — hydrALAZINE HCL 25 MG TABS: 25 | 30 days supply | Qty: 90 | Fill #2

## 2020-04-24 MED FILL — METHOCARBAMOL 500 MG TABS: 500 | 15 days supply | Qty: 90 | Fill #1

## 2020-04-24 MED FILL — ATORVASTATIN 80 MG TABLET: 80 | 30 days supply | Qty: 30 | Fill #1

## 2020-04-24 MED FILL — ?METOPROLOL SUCC ER 50M TAB: 50 | 30 days supply | Qty: 30 | Fill #2

## 2020-05-15 MED FILL — MECLIZINE 25 MG TABLET: 25 | 15 days supply | Qty: 30 | Fill #1

## 2020-05-15 MED FILL — $LANTUS 100 UNITS/ML VIAL: 100 | 75 days supply | Qty: 30 | Fill #2

## 2020-05-15 MED FILL — LISINOPRIL 40 MG TABLET: 40 | 30 days supply | Qty: 30 | Fill #3

## 2020-05-15 MED FILL — ?CLOPIDOGREL 75MG TA: 75 | 30 days supply | Qty: 30 | Fill #3

## 2020-05-15 MED FILL — ?METOPROLOL SUCC ER 50M TAB: 50 | 30 days supply | Qty: 30 | Fill #3

## 2020-05-15 MED FILL — hydrALAZINE HCL 25 MG TABS: 25 | 30 days supply | Qty: 90 | Fill #3

## 2020-05-15 MED FILL — ATORVASTATIN CALCIUM 80 MG: 80 | 30 days supply | Qty: 30 | Fill #2

## 2020-06-10 MED FILL — $LANTUS 100 UNITS/ML VIAL: 100 | 22 days supply | Qty: 10 | Fill #0

## 2020-06-10 MED FILL — ACETAMINOPHEN-COD #3 TABLET: 300-30 | 20 days supply | Qty: 40 | Fill #0

## 2020-06-11 MED FILL — GABAPENTIN 300 MG CAPSULE: 300 | 30 days supply | Qty: 270 | Fill #0

## 2020-06-11 MED FILL — LISINOPRIL 40 MG TABLET: 40 | 30 days supply | Qty: 30 | Fill #4

## 2020-06-11 MED FILL — ?CLOPIDOGREL 75MG TA: 75 | 30 days supply | Qty: 30 | Fill #4

## 2020-06-12 MED FILL — ?MECLIZINE 25MG TAB: 25 | 15 days supply | Qty: 30 | Fill #0

## 2020-07-14 MED FILL — LISINOPRIL 40 MG TABLET: 40 | 30 days supply | Qty: 30 | Fill #5

## 2020-07-14 MED FILL — ACETAMINOPHEN-COD #3 TABLET: 300-30 | 20 days supply | Qty: 40 | Fill #1

## 2020-07-14 MED FILL — GABAPENTIN 300 MG CAPSULE: 300 | 30 days supply | Qty: 270 | Fill #1

## 2020-07-14 MED FILL — hydrALAZINE HCL 25 MG TABS: 25 | 30 days supply | Qty: 90 | Fill #4

## 2020-07-14 MED FILL — ATORVASTATIN CALCIUM 80 MG: 80 | 30 days supply | Qty: 30 | Fill #3

## 2020-07-14 MED FILL — ?CLOPIDOGREL 75MG TA: 75 | 30 days supply | Qty: 30 | Fill #5

## 2020-07-14 MED FILL — ?MECLIZINE 25MG TAB: 25 | 15 days supply | Qty: 30 | Fill #0

## 2020-07-14 MED FILL — ?METOPROLOL SUCC ER 50M TAB: 50 | 30 days supply | Qty: 30 | Fill #4

## 2020-07-14 MED FILL — $LANTUS 100 UNITS/ML VIAL: 100 | 22 days supply | Qty: 10 | Fill #1

## 2020-08-08 MED FILL — ATORVASTATIN CALCIUM 80 MG: 80 | 30 days supply | Qty: 30 | Fill #4

## 2020-08-08 MED FILL — LISINOPRIL 40 MG TABLET: 40 | 30 days supply | Qty: 30 | Fill #6

## 2020-08-08 MED FILL — ?CLOPIDOGREL 75MG TA: 75 | 30 days supply | Qty: 30 | Fill #6

## 2020-08-08 MED FILL — $LANTUS 100 UNITS/ML VIAL: 100 | 22 days supply | Qty: 10 | Fill #2

## 2020-08-08 MED FILL — ?MECLIZINE 25MG TAB: 25 | 15 days supply | Qty: 30 | Fill #1

## 2020-08-26 MED FILL — METHOCARBAMOL 500 MG TABS: 500 | 15 days supply | Qty: 90 | Fill #0

## 2020-08-26 MED FILL — $LANTUS 100 UNITS/ML VIAL: 100 | 22 days supply | Qty: 10 | Fill #3

## 2020-09-02 MED FILL — METOPROLOL SUCCINATE ER 50: 50 | 30 days supply | Qty: 30 | Fill #5

## 2020-09-15 MED FILL — FLUCONAZOLE 150 MG TABLET: 150 | 1 days supply | Qty: 1 | Fill #0

## 2020-09-15 MED FILL — $LANTUS 100 UNITS/ML VIAL: 100 | 65 days supply | Qty: 30 | Fill #0

## 2020-09-15 MED FILL — ATORVASTATIN CALCIUM 80 MG: 80 | 30 days supply | Qty: 30 | Fill #0

## 2020-09-15 MED FILL — METHOCARBAMOL 500 MG TABS: 500 | 15 days supply | Qty: 90 | Fill #0

## 2020-09-15 MED FILL — LISINOPRIL 40 MG TABLET: 40 | 30 days supply | Qty: 30 | Fill #0

## 2020-09-15 MED FILL — AMITRIPTYLINE HCL 50 MG TAB: 50 | 30 days supply | Qty: 30 | Fill #0

## 2020-09-15 MED FILL — ?CLOPIDOGREL 75 MG TABL: 75 | 30 days supply | Qty: 30 | Fill #0

## 2020-09-15 MED FILL — hydrALAZINE HCL 50 MG TABS: 50 | 30 days supply | Qty: 90 | Fill #0

## 2020-09-15 MED FILL — ISOSORBIDE MN ER 60 MG TAB: 60 | 30 days supply | Qty: 30 | Fill #0

## 2020-09-15 MED FILL — ?DULOXetine HCL 60MG CAPS: 60 | 30 days supply | Qty: 30 | Fill #0

## 2020-09-15 MED FILL — ?HUMALOG 100 UNITS/ML VIAL: 100 | 28 days supply | Qty: 10 | Fill #0

## 2020-09-15 MED FILL — GABAPENTIN 300 MG CAPSULE: 300 | 30 days supply | Qty: 270 | Fill #0

## 2020-10-20 MED FILL — LISINOPRIL 40 MG TABLET: 40 | 30 days supply | Qty: 30 | Fill #1

## 2020-10-20 MED FILL — METOPROLOL SUCCINATE ER 50: 50 | 30 days supply | Qty: 30 | Fill #0

## 2020-10-20 MED FILL — ?CLOPIDOGREL 75 MG TABL: 75 | 30 days supply | Qty: 30 | Fill #1

## 2020-10-20 MED FILL — ATORVASTATIN CALCIUM 80 MG: 80 | 30 days supply | Qty: 30 | Fill #1

## 2020-10-20 MED FILL — ISOSORBIDE MN ER 60 MG TAB: 60 | 30 days supply | Qty: 30 | Fill #1

## 2020-10-20 MED FILL — hydrALAZINE HCL 50 MG TABS: 50 | 30 days supply | Qty: 90 | Fill #1

## 2020-11-14 MED FILL — METOPROLOL SUCCINATE ER 50: 50 | 30 days supply | Qty: 30 | Fill #1

## 2020-11-14 MED FILL — LISINOPRIL 40 MG TAB: 40 | 30 days supply | Qty: 30 | Fill #2

## 2020-11-14 MED FILL — ATORVASTATIN CALCIUM 80 MG: 80 | 30 days supply | Qty: 30 | Fill #2

## 2020-11-14 MED FILL — ISOSORBIDE MN ER 60 MG TAB: 60 | 30 days supply | Qty: 30 | Fill #2

## 2020-11-14 MED FILL — ?CLOPIDOGREL 75MG TABL: 75 | 30 days supply | Qty: 30 | Fill #2

## 2020-11-14 MED FILL — hydrALAZINE HCL 50 MG TABS: 50 | 30 days supply | Qty: 90 | Fill #2

## 2022-02-08 DIAGNOSIS — K529 Noninfective gastroenteritis and colitis, unspecified: Secondary | ICD-10-CM

## 2022-02-08 HISTORY — DX: Cerebral infarction, unspecified: I63.9

## 2022-02-08 HISTORY — DX: Type 2 diabetes mellitus without complications: E11.9

## 2022-02-08 HISTORY — DX: Essential (primary) hypertension: I10

## 2022-02-08 NOTE — ED Triage Notes (Addendum)
Pt coming from home via EMS with c/o lower abdominal pain x2 days. Pt also reports bright red blood in her stools. Denies nausea at this time. Denies any new urinary symptoms or difficulty urinating. Last bowel movement two days ago.

## 2022-02-08 NOTE — Discharge Instructions (Addendum)
You were seen in the emergency department for evaluation of your abdominal pain as well as your bloody mucus stools.  Your CT scan shows that you have colitis.  This is an inflammation of your large intestine.  You will need to follow-up with a GI doctor to find out the etiology of this.  In the meantime, you will take Augmentin twice daily for the next 7 days.  If you continue to have worsening abdominal pain, fever, bloody stools, fatigue, vomiting, or black stools, please return the nearest emergency department for reevaluation.   Lo vieron en el departamento de emergencias para evaluar su dolor abdominal y sus heces mucosas con sangre. Su tomografa computarizada muestra que tiene colitis. Esta es una inflamacin de su intestino grueso. Deber hacer un seguimiento con un mdico GI para Insurance underwriter de esto. Mientras tanto, tomar Augmentin dos veces al [employer] prximos 7 das. Si contina teniendo un empeoramiento del dolor abdominal, fiebre, heces con [place], fatiga, vmitos o heces negras, regrese al departamento de emergencias ms cercano para una reevaluacin.  Comunquese con un mdico si: Los sntomas no desaparecen. Tiene nuevos sntomas. Solicite ayuda de inmediato si: Tiene fiebre que no desaparece con tratamiento. Comienza a sentir escalofros. Se siente muy dbil, se desmaya o se deshidrata. Vomita repetidas veces. Siente dolor intenso en el abdomen. Su materia fecal es sanguinolenta o alquitranada. Comunquese con un mdico si: Tiene fiebre. La diarrea empeora. Aparecen nuevos sntomas. No puede retener los lquidos. Se siente aturdido o mareado. Tiene dolor de [place]. Tiene calambres musculares. Solicite ayuda de inmediato si: Midwife. Se siente muy dbil o se desmaya. La sangre de la diarrea aumenta o cambia de color. Vomita, y el vmito contiene sangre o es negro. Tiene diarrea persistente. Tiene dolor intenso, clicos o distensin  abdominal. Tiene problemas para respirar o respira muy rpidamente. Su corazn late muy rpidamente. Siente la piel fra y hmeda. Se siente confundido. Tiene signos de deshidratacin, como los siguientes: Orina de color oscuro, muy escasa o falta de [place]. Labios agrietados. Sequedad de boca. Ojos hundidos. Somnolencia. Debilidad.

## 2023-07-05 IMAGING — CT CT ABD-PELV W/ CM
2 of 5 series · 16 of 46 positions shown, 18 images · IV contrast (agent unspecified)
Comparison: 10/05/2021

CLINICAL DATA: Generalized abdominal pain for 2 days with bright
red blood per rectum

EXAM:
CT ABDOMEN AND PELVIS WITH CONTRAST
TECHNIQUE: Multidetector CT imaging of the abdomen and pelvis was performed
using the standard protocol following bolus administration of
intravenous contrast.

[Series 2: axial st · axial · 0.91mm/px · z∈[+1017,+1447]mm · 13 of 102 slices shown, 15 images]
[im 8/102  soft-tissue]
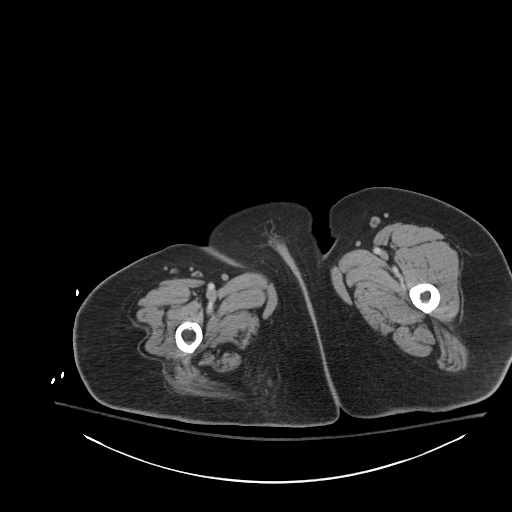
[im 8/102  bone]
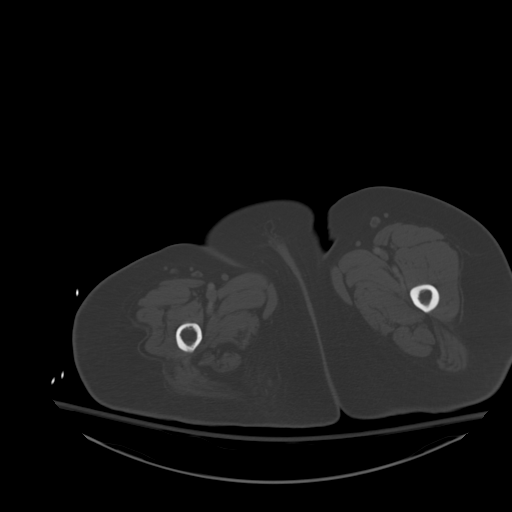
[im 15/102  soft-tissue]
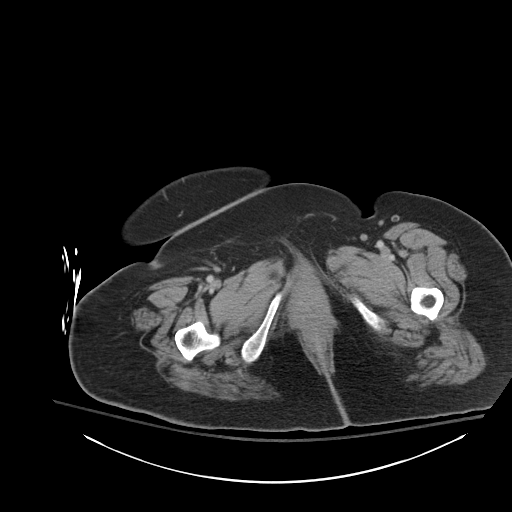
[im 22/102  soft-tissue]
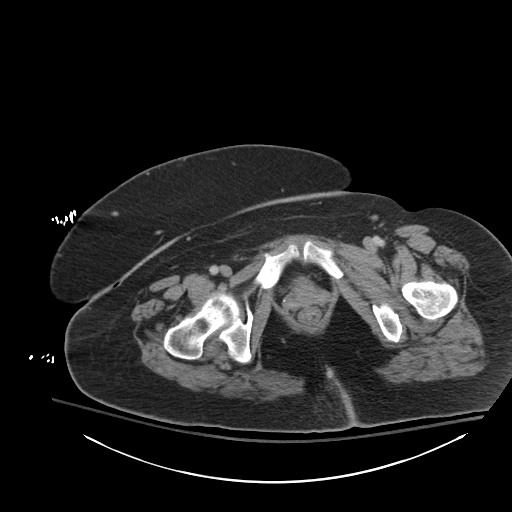
[im 29/102  soft-tissue]
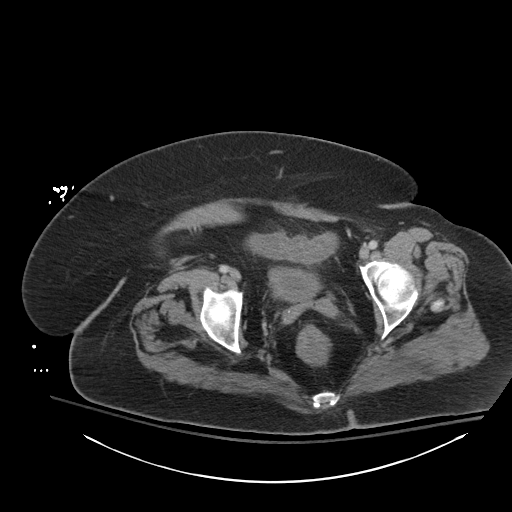
[im 37/102  soft-tissue]
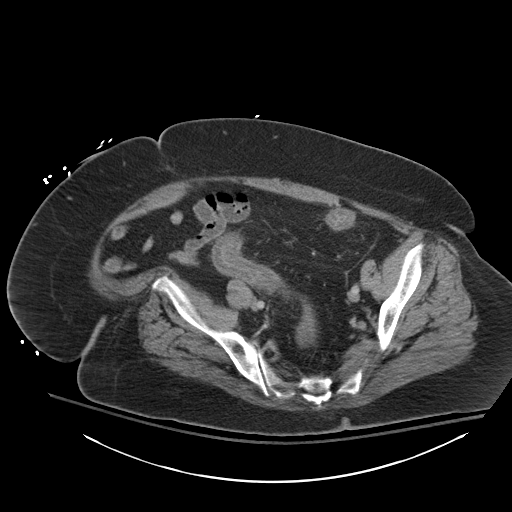
[im 44/102  soft-tissue]
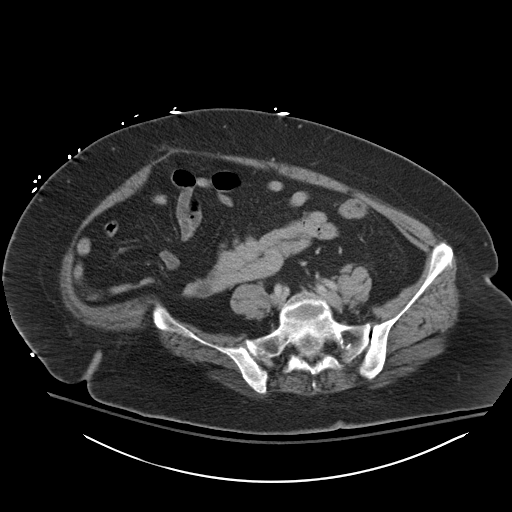
[im 51/102  soft-tissue]
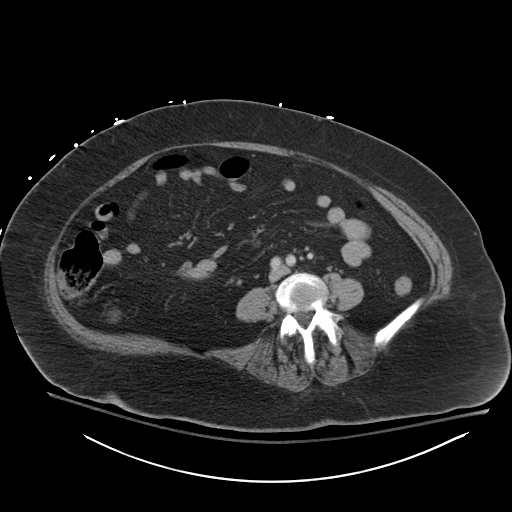
[im 58/102  soft-tissue]
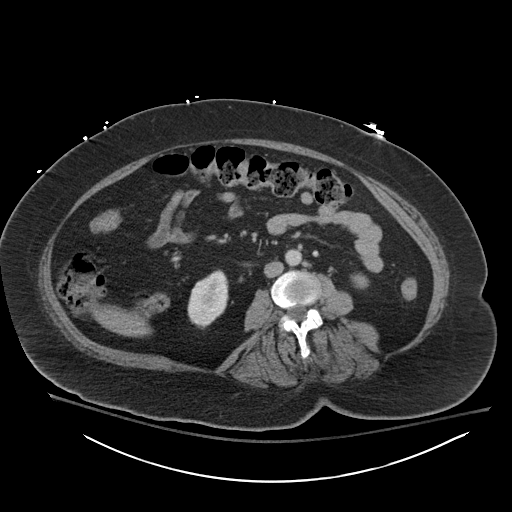
[im 65/102  soft-tissue]
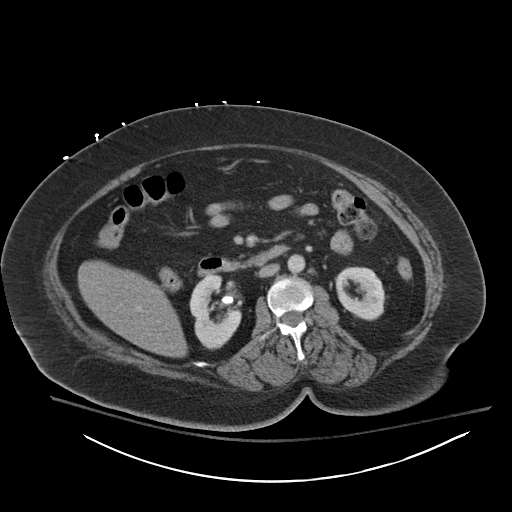
[im 65/102  bone]
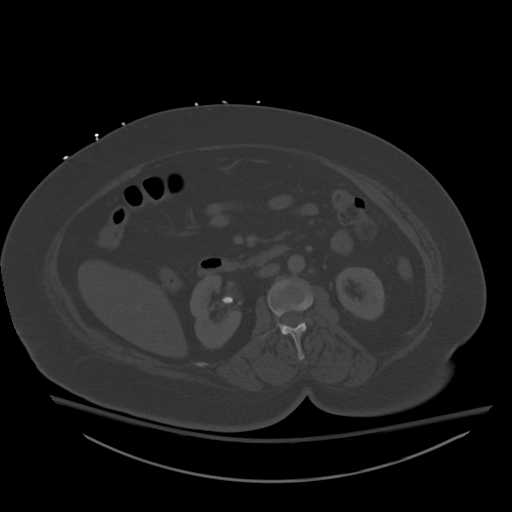
[im 73/102  soft-tissue]
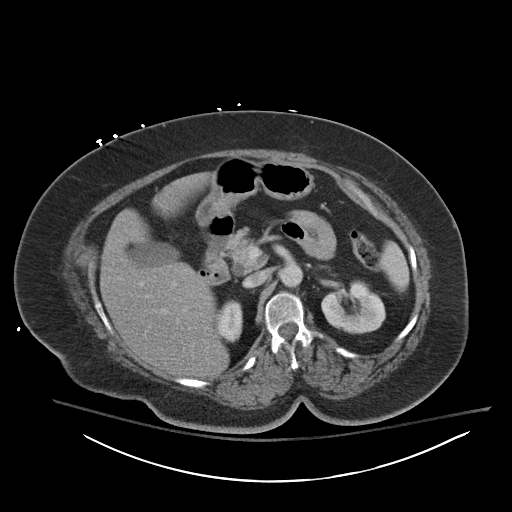
[im 80/102  soft-tissue]
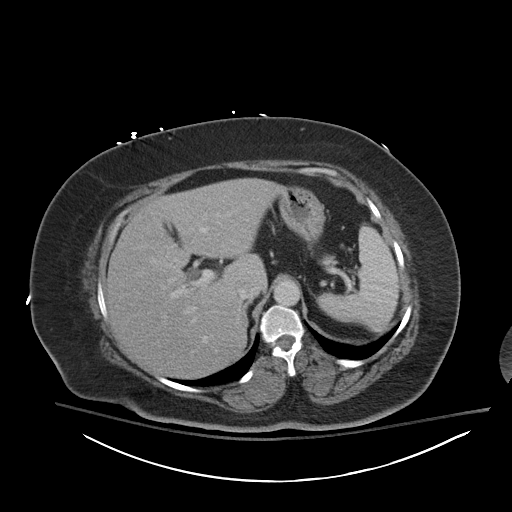
[im 87/102  soft-tissue]
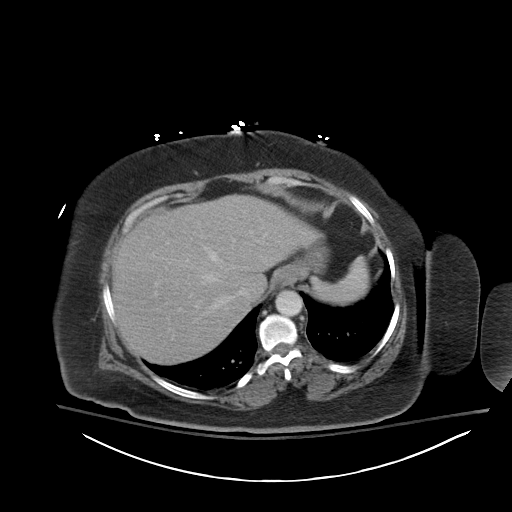
[im 94/102  soft-tissue]
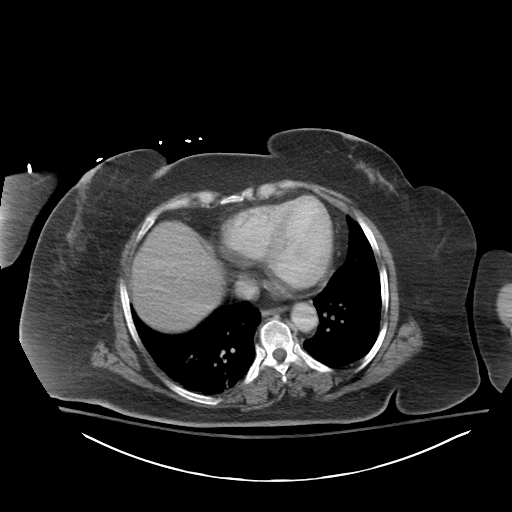

[Series 4: coronal st · coronal · 1.04mm/px · 3 of 156 slices shown]
[im 52/156  soft-tissue]
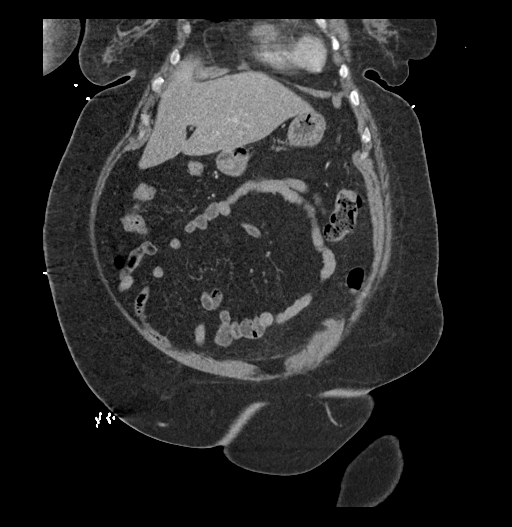
[im 69/156  soft-tissue]
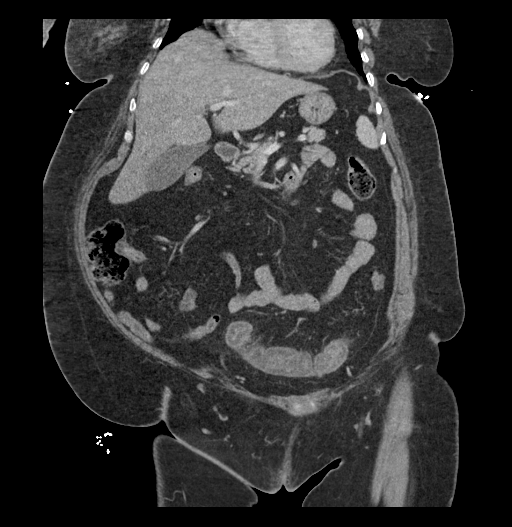
[im 87/156  soft-tissue]
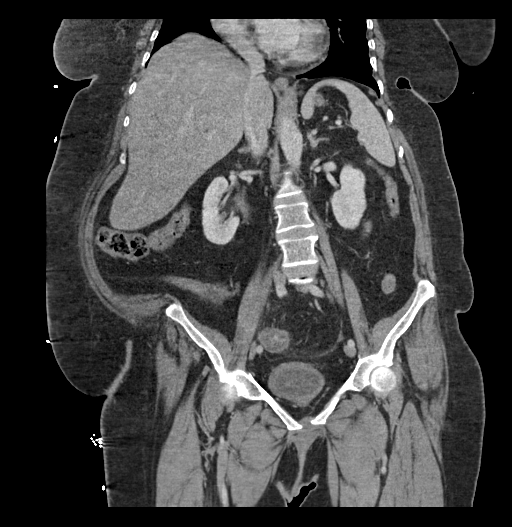

[16 of 46 positions shown; findings below may reference images not displayed]

RADIATION DOSE REDUCTION: This exam was performed according to the
departmental dose-optimization program which includes automated
exposure control, adjustment of the mA and/or kV according to
patient size and/or use of iterative reconstruction technique.

CONTRAST:  100mL OMNIPAQUE IOHEXOL 300 MG/ML  SOLN
FINDINGS: Lower chest: No acute abnormality.

Hepatobiliary: Fatty infiltration of the liver is noted. Gallbladder
is well distended without acute abnormality.

Pancreas: Unremarkable. No pancreatic ductal dilatation or
surrounding inflammatory changes.

Spleen: Normal in size without focal abnormality.

Adrenals/Urinary Tract: Adrenal glands are within normal limits.
Kidneys demonstrate a normal enhancement pattern bilaterally. Right
renal pelvic stone is noted measuring up to 2.1 cm. This has
increased slightly in the interval from the prior exam. No
obstructive changes are seen. Ureters are within normal limits
bilaterally. Bladder is decompressed.

Stomach/Bowel: Colon shows some edema within the wall involving the
sigmoid and rectum with some pericolonic inflammatory change
consistent with focal colitis. More proximal colon appears within
normal limits. Changes of prior appendectomy are noted. Small bowel
and stomach are within normal limits.

Vascular/Lymphatic: No significant vascular findings are present. No
enlarged abdominal or pelvic lymph nodes.

Reproductive: Status post hysterectomy. No adnexal masses.

Other: No abdominal wall hernia or abnormality. No abdominopelvic
ascites.

Musculoskeletal: Degenerative changes of the lumbar spine are seen.
IMPRESSION: Right renal pelvic stone without obstructive change. This is
increased slightly in the interval from the prior exam.

Changes consistent with acute colitis involving the rectum and
sigmoid without evidence of perforation.
